# Patient Record
Sex: Male | Born: 1970 | Race: Black or African American | Hispanic: No | Marital: Single | Smoking: Former smoker
Health system: Southern US, Community
[De-identification: ages and names within clinical notes are randomized; demographics above are authoritative.]

## PROBLEM LIST (undated history)

## (undated) DIAGNOSIS — J45909 Unspecified asthma, uncomplicated: Secondary | ICD-10-CM

---

## 2014-05-05 ENCOUNTER — Ambulatory Visit: Payer: Self-pay | Admitting: Internal Medicine

## 2014-05-26 ENCOUNTER — Ambulatory Visit: Payer: Self-pay | Admitting: Internal Medicine

## 2014-12-12 ENCOUNTER — Emergency Department (HOSPITAL_COMMUNITY)
Admission: EM | Admit: 2014-12-12 | Discharge: 2014-12-13 | Disposition: A | Discharge status: Home / Self Care | Payer: Self-pay | Attending: Emergency Medicine | Admitting: Emergency Medicine

## 2014-12-12 ENCOUNTER — Emergency Department (HOSPITAL_COMMUNITY): Payer: Self-pay

## 2014-12-12 ENCOUNTER — Encounter (HOSPITAL_COMMUNITY): Payer: Self-pay | Admitting: Vascular Surgery

## 2014-12-12 DIAGNOSIS — Z87891 Personal history of nicotine dependence: Secondary | ICD-10-CM | POA: Insufficient documentation

## 2014-12-12 DIAGNOSIS — J4541 Moderate persistent asthma with (acute) exacerbation: Secondary | ICD-10-CM | POA: Insufficient documentation

## 2014-12-12 HISTORY — DX: Unspecified asthma, uncomplicated: J45.909

## 2014-12-12 MED ORDER — ALBUTEROL SULFATE (2.5 MG/3ML) 0.083% IN NEBU
INHALATION_SOLUTION | RESPIRATORY_TRACT | Status: AC
  Filled 2014-12-12: qty 3

## 2014-12-12 MED ORDER — PREDNISONE 20 MG PO TABS
60.0000 mg | ORAL_TABLET | Freq: Every day | ORAL | Status: DC
  Administered 2014-12-12: 60 mg via ORAL
  Filled 2014-12-12: qty 3

## 2014-12-12 MED ORDER — ALBUTEROL SULFATE (2.5 MG/3ML) 0.083% IN NEBU
5.0000 mg | INHALATION_SOLUTION | Freq: Once | RESPIRATORY_TRACT | Status: AC
  Administered 2014-12-12: 5 mg via RESPIRATORY_TRACT

## 2014-12-12 MED ORDER — ALBUTEROL (5 MG/ML) CONTINUOUS INHALATION SOLN
10.0000 mg/h | INHALATION_SOLUTION | RESPIRATORY_TRACT | Status: DC
  Administered 2014-12-12: 10 mg/h via RESPIRATORY_TRACT
  Filled 2014-12-12: qty 20

## 2014-12-12 NOTE — ED Provider Notes (Signed)
CSN: [id number]     Arrival date & time 12/12/14  2143 History  This chart was scribed for non-physician practitioner, Langston Masker, PA-C, working with Arby Barrette, MD, by Ronney Lion, ED Scribe. This patient was seen in room TR07C/TR07C and the patient's care was started at 10:36 PM.    Chief Complaint  Patient presents with  . Asthma  . Shortness of Breath   The history is provided by the patient. No language interpreter was used.    HPI Comments: Andrew Hammond is a 44 y.o. male with a history of asthma, who presents to the Emergency Department complaining of SOB that began yesterday afternoon. He states he has not had any SOB or asthma exacerbations since he was a child. He also notes some nasal congestion and drainage recently. Patient states the breathing treatment given to him here has provided significant relief. Patient is a former smoker but states he quit last July. Patient does not have a PCP.   Past Medical History  Diagnosis Date  . Asthma    History reviewed. No pertinent past surgical history. No family history on file. Social History  Substance Use Topics  . Smoking status: Former Smoker    Types: Cigarettes    Quit date: 10/20/2014  . Smokeless tobacco: Never Used  . Alcohol Use: Yes     Comment: occasionally    Review of Systems  HENT: Positive for congestion and rhinorrhea.   Respiratory: Positive for shortness of breath.   All other systems reviewed and are negative.   Allergies  Review of patient's allergies indicates not on file.  Home Medications   Prior to Admission medications   Not on File   BP 119/81 mmHg  Pulse 74  Temp(Src) 98.4 F (36.9 C) (Oral)  Resp 20  Ht  (1.727 m)  Wt 170 lb (77.111 kg)  BMI 25.85 kg/m2  SpO2 96% Physical Exam  Constitutional: He is oriented to person, place, and time. He appears well-developed and well-nourished. No distress.  HENT:  Head: Normocephalic and atraumatic.  Eyes: Conjunctivae and EOM are  normal.  Neck: Neck supple. No tracheal deviation present.  Cardiovascular: Normal rate.   Pulmonary/Chest: Effort normal. No respiratory distress. He has wheezes.  Wheezing in all lobes.   Musculoskeletal: Normal range of motion.  Neurological: He is alert and oriented to person, place, and time.  Skin: Skin is warm and dry.  Psychiatric: He has a normal mood and affect. His behavior is normal.  Nursing note and vitals reviewed.   ED Course  Procedures (including critical care time)  DIAGNOSTIC STUDIES: Oxygen Saturation is 96% on RA, normal by my interpretation.    COORDINATION OF CARE: 10:40 PM - Discussed treatment plan with pt at bedsde which includes CXR to r/o pneumonia, followed by another breathing trreatmnetn. Will also Rx prednisone and inhaler. Pt verbalized understanding and agreed to plan.   MDM  Chest xray negative,  Pt given prednisone  po. No relief with duo neb  Pt given 1 hour continuous neb.  re exam lungs clear   Final diagnoses:  Asthma, moderate persistent, with acute exacerbation    Prednisone taper Albuterol inhaler  Elson Areas, PA-C 12/13/14 2952  Arby Barrette, MD 12/16/14 2351

## 2014-12-12 NOTE — ED Notes (Signed)
Pt reports to the ED for eval of SOB that started yesterday. Has hx of asthma but has not had problems with it since he was a child. Also complaining of nasal congestion and drainage. Wheezing noted. Denies any CP. Pt A&Ox4, resp e/u, and skin warm and dry.

## 2014-12-13 MED ORDER — ALBUTEROL SULFATE HFA 108 (90 BASE) MCG/ACT IN AERS: 1.0000 | INHALATION_SPRAY | Freq: Four times a day (QID) | RESPIRATORY_TRACT | Status: DC | PRN

## 2014-12-13 MED ORDER — ALBUTEROL SULFATE HFA 108 (90 BASE) MCG/ACT IN AERS
2.0000 | INHALATION_SPRAY | RESPIRATORY_TRACT | Status: DC
  Administered 2014-12-13: 2 via RESPIRATORY_TRACT
  Filled 2014-12-13: qty 6.7

## 2014-12-13 MED ORDER — PREDNISONE 10 MG (21) PO TBPK: 10.0000 mg | ORAL_TABLET | Freq: Every day | ORAL | Status: DC

## 2014-12-13 NOTE — Discharge Instructions (Signed)
Asthma Attack Prevention Although there is no way to prevent asthma from starting, you can take steps to control the disease and reduce its symptoms. Learn about your asthma and how to control it. Take an active role to control your asthma by working with your health care provider to create and follow an asthma action plan. An asthma action plan guides you in:  Taking your medicines properly.  Avoiding things that set off your asthma or make your asthma worse (asthma triggers).  Tracking your level of asthma control.  Responding to worsening asthma.  Seeking emergency care when needed. To track your asthma, keep records of your symptoms, check your peak flow number using a handheld device that shows how well air moves out of your lungs (peak flow meter), and get regular asthma checkups.  WHAT ARE SOME WAYS TO PREVENT AN ASTHMA ATTACK?  Take medicines as directed by your health care provider.  Keep track of your asthma symptoms and level of control.  With your health care provider, write a detailed plan for taking medicines and managing an asthma attack. Then be sure to follow your action plan. Asthma is an ongoing condition that needs regular monitoring and treatment.  Identify and avoid asthma triggers. Many outdoor allergens and irritants (such as pollen, mold, cold air, and air pollution) can trigger asthma attacks. Find out what your asthma triggers are and take steps to avoid them.  Monitor your breathing. Learn to recognize warning signs of an attack, such as coughing, wheezing, or shortness of breath. Your lung function may decrease before you notice any signs or symptoms, so regularly measure and record your peak airflow with a home peak flow meter.  Identify and treat attacks early. If you act quickly, you are less likely to have a severe attack. You will also need less medicine to control your symptoms. When your peak flow measurements decrease and alert you to an upcoming attack,  take your medicine as instructed and immediately stop any activity that may have triggered the attack. If your symptoms do not improve, get medical help.  Pay attention to increasing quick-relief inhaler use. If you find yourself relying on your quick-relief inhaler, your asthma is not under control. See your health care provider about adjusting your treatment. WHAT CAN MAKE MY SYMPTOMS WORSE? A number of common things can set off or make your asthma symptoms worse and cause temporary increased inflammation of your airways. Keep track of your asthma symptoms for several weeks, detailing all the environmental and emotional factors that are linked with your asthma. When you have an asthma attack, go back to your asthma diary to see which factor, or combination of factors, might have contributed to it. Once you know what these factors are, you can take steps to control many of them. If you have allergies and asthma, it is important to take asthma prevention steps at home. Minimizing contact with the substance to which you are allergic will help prevent an asthma attack. Some triggers and ways to avoid these triggers are: Animal Dander:  Some people are allergic to the flakes of skin or dried saliva from animals with fur or feathers.   There is no such thing as a hypoallergenic dog or cat breed. All dogs or cats can cause allergies, even if they don't shed.  Keep these pets out of your home.  If you are not able to keep a pet outdoors, keep the pet out of your bedroom and other sleeping areas at all  times, and keep the door closed. °· Remove carpets and furniture covered with cloth from your home. If that is not possible, keep the pet away from fabric-covered furniture and carpets. °Dust Mites: °Many people with asthma are allergic to dust mites. Dust mites are tiny bugs that are found in every home in mattresses, pillows, carpets, fabric-covered furniture, bedcovers, clothes, stuffed toys, and other  fabric-covered items.  °· Cover your mattress in a special dust-proof cover. °· Cover your pillow in a special dust-proof cover, or wash the pillow each week in hot water. Water must be hotter than 130° F (54.4° C) to kill dust mites. Cold or warm water used with detergent and bleach can also be effective. °· Wash the sheets and blankets on your bed each week in hot water. °· Try not to sleep or lie on cloth-covered cushions. °· Call ahead when traveling and ask for a smoke-free hotel room. Bring your own bedding and pillows in case the hotel only supplies feather pillows and down comforters, which may contain dust mites and cause asthma symptoms. °· Remove carpets from your bedroom and those laid on concrete, if you can. °· Keep stuffed toys out of the bed, or wash the toys weekly in hot water or cooler water with detergent and bleach. °Cockroaches: °Many people with asthma are allergic to the droppings and remains of cockroaches.  °· Keep food and garbage in closed containers. Never leave food out. °· Use poison baits, traps, powders, gels, or paste (for example, boric acid). °· If a spray is used to kill cockroaches, stay out of the room until the odor goes away. °Indoor Mold: °· Fix leaky faucets, pipes, or other sources of water that have mold around them. °· Clean floors and moldy surfaces with a fungicide or diluted bleach. °· Avoid using humidifiers, vaporizers, or swamp coolers. These can spread molds through the air. °Pollen and Outdoor Mold: °· When pollen or mold spore counts are high, try to keep your windows closed. °· Stay indoors with windows closed from late morning to afternoon. Pollen and some mold spore counts are highest at that time. °· Ask your health care provider whether you need to take anti-inflammatory medicine or increase your dose of the medicine before your allergy season starts. °Other Irritants to Avoid: °· Tobacco smoke is an irritant. If you smoke, ask your health care provider how  you can quit. Ask family members to quit smoking, too. Do not allow smoking in your home or car. °· If possible, do not use a wood-burning stove, kerosene heater, or fireplace. Minimize exposure to all sources of smoke, including incense, candles, fires, and fireworks. °· Try to stay away from strong odors and sprays, such as perfume, talcum powder, hair spray, and paints. °· Decrease humidity in your home and use an indoor air cleaning device. Reduce indoor humidity to below 60%. Dehumidifiers or central air conditioners can do this. °· Decrease house dust exposure by changing furnace and air cooler filters frequently. °· Try to have someone else vacuum for you once or twice a week. Stay out of rooms while they are being vacuumed and for a short while afterward. °· If you vacuum, use a dust mask from a hardware store, a double-layered or microfilter vacuum cleaner bag, or a vacuum cleaner with a HEPA filter. °· Sulfites in foods and beverages can be irritants. Do not drink beer or wine or eat dried fruit, processed potatoes, or shrimp if they cause asthma symptoms. °· Cold   air can trigger an asthma attack. Cover your nose and mouth with a scarf on cold or windy days.  Several health conditions can make asthma more difficult to manage, including a runny nose, sinus infections, reflux disease, psychological stress, and sleep apnea. Work with your health care provider to manage these conditions.  Avoid close contact with people who have a respiratory infection such as a cold or the flu, since your asthma symptoms may get worse if you catch the infection. Wash your hands thoroughly after touching items that may have been handled by people with a respiratory infection.  Get a flu shot every year to protect against the flu virus, which often makes asthma worse for days or weeks. Also get a pneumonia shot if you have not previously had one. Unlike the flu shot, the pneumonia shot does not need to be given  yearly. Medicines:  Talk to your health care provider about whether it is safe for you to take aspirin or non-steroidal anti-inflammatory medicines (NSAIDs). In a small number of people with asthma, aspirin and NSAIDs can cause asthma attacks. These medicines must be avoided by people who have known aspirin-sensitive asthma. It is important that people with aspirin-sensitive asthma read labels of all over-the-counter medicines used to treat pain, colds, coughs, and fever.  Beta-blockers and ACE inhibitors are other medicines you should discuss with your health care provider. HOW CAN I FIND OUT WHAT I AM ALLERGIC TO? Ask your asthma health care provider about allergy skin testing or blood testing (the RAST test) to identify the allergens to which you are sensitive. If you are found to have allergies, the most important thing to do is to try to avoid exposure to any allergens that you are sensitive to as much as possible. Other treatments for allergies, such as medicines and allergy shots (immunotherapy) are available.  CAN I EXERCISE? Follow your health care provider's advice regarding asthma treatment before exercising. It is important to maintain a regular exercise program, but vigorous exercise or exercise in cold, humid, or dry environments can cause asthma attacks, especially for those people who have exercise-induced asthma. Document Released: 03/05/2009 Document Revised: 03/22/2013 Document Reviewed: 09/22/2012 [facility] Patient Information 2015 [place], [place]. This information is not intended to replace advice given to you by your health care provider. Make sure you discuss any questions you have with your health care provider.  Asthma Asthma is a recurring condition in which the airways tighten and narrow. Asthma can make it difficult to breathe. It can cause coughing, wheezing, and shortness of breath. Asthma episodes, also called asthma attacks, range from minor to life-threatening. Asthma  cannot be cured, but medicines and lifestyle changes can help control it. CAUSES Asthma is believed to be caused by inherited (genetic) and environmental factors, but its exact cause is unknown. Asthma may be triggered by allergens, lung infections, or irritants in the air. Asthma triggers are different for each person. Common triggers include:   Animal dander.  Dust mites.  Cockroaches.  Pollen from trees or grass.  Mold.  Smoke.  Air pollutants such as dust, household cleaners, hair sprays, aerosol sprays, paint fumes, strong chemicals, or strong odors.  Cold air, weather changes, and winds (which increase molds and pollens in the air).  Strong emotional expressions such as crying or laughing hard.  Stress.  Certain medicines (such as aspirin) or types of drugs (such as beta-blockers).  Sulfites in foods and drinks. Foods and drinks that may contain sulfites include dried fruit, potato  chips, and sparkling grape juice. °· Infections or inflammatory conditions such as the flu, a cold, or an inflammation of the nasal membranes (rhinitis). °· Gastroesophageal reflux disease (GERD). °· Exercise or strenuous activity. °SYMPTOMS °Symptoms may occur immediately after asthma is triggered or many hours later. Symptoms include: °· Wheezing. °· Excessive nighttime or early morning coughing. °· Frequent or severe coughing with a common cold. °· Chest tightness. °· Shortness of breath. °DIAGNOSIS  °The diagnosis of asthma is made by a review of your medical history and a physical exam. Tests may also be performed. These may include: °· Lung function studies. These tests show how much air you breathe in and out. °· Allergy tests. °· Imaging tests such as X-rays. °TREATMENT  °Asthma cannot be cured, but it can usually be controlled. Treatment involves identifying and avoiding your asthma triggers. It also involves medicines. There are 2 classes of medicine used for asthma treatment:  °· Controller  medicines. These prevent asthma symptoms from occurring. They are usually taken every day. °· Reliever or rescue medicines. These quickly relieve asthma symptoms. They are used as needed and provide short-term relief. °Your health care provider will help you create an asthma action plan. An asthma action plan is a written plan for managing and treating your asthma attacks. It includes a list of your asthma triggers and how they may be avoided. It also includes information on when medicines should be taken and when their dosage should be changed. An action plan may also involve the use of a device called a peak flow meter. A peak flow meter measures how well the lungs are working. It helps you monitor your condition. °HOME CARE INSTRUCTIONS  °· Take medicines only as directed by your health care provider. Speak with your health care provider if you have questions about how or when to take the medicines. °· Use a peak flow meter as directed by your health care provider. Record and keep track of readings. °· Understand and use the action plan to help minimize or stop an asthma attack without needing to seek medical care. °· Control your home environment in the following ways to help prevent asthma attacks: °¨ Do not smoke. Avoid being exposed to secondhand smoke. °¨ Change your heating and air conditioning filter regularly. °¨ Limit your use of fireplaces and wood stoves. °¨ Get rid of pests (such as roaches and mice) and their droppings. °¨ Throw away plants if you see mold on them. °¨ Clean your floors and dust regularly. Use unscented cleaning products. °¨ Try to have someone else vacuum for you regularly. Stay out of rooms while they are being vacuumed and for a short while afterward. If you vacuum, use a dust mask from a hardware store, a double-layered or microfilter vacuum cleaner bag, or a vacuum cleaner with a HEPA filter. °¨ Replace carpet with wood, tile, or vinyl flooring. Carpet can trap dander and  dust. °¨ Use allergy-proof pillows, mattress covers, and box spring covers. °¨ Wash bed sheets and blankets every week in hot water and dry them in a dryer. °¨ Use blankets that are made of polyester or cotton. °¨ Clean bathrooms and kitchens with bleach. If possible, have someone repaint the walls in these rooms with mold-resistant paint. Keep out of the rooms that are being cleaned and painted. °¨ Wash hands frequently. °SEEK MEDICAL CARE IF:  °· You have wheezing, shortness of breath, or a cough even if taking medicine to prevent attacks. °· The colored mucus   you cough up (sputum) is thicker than usual.  Your sputum changes from clear or white to yellow, green, gray, or bloody.  You have any problems that may be related to the medicines you are taking (such as a rash, itching, swelling, or trouble breathing).  You are using a reliever medicine more than 2-3 times per week.  Your peak flow is still at 50-79% of your personal best after following your action plan for 1 hour.  You have a fever. SEEK IMMEDIATE MEDICAL CARE IF:   You seem to be getting worse and are unresponsive to treatment during an asthma attack.  You are short of breath even at rest.  You get short of breath when doing very little physical activity.  You have difficulty eating, drinking, or talking due to asthma symptoms.  You develop chest pain.  You develop a fast heartbeat.  You have a bluish color to your lips or fingernails.  You are light-headed, dizzy, or faint.  Your peak flow is less than 50% of your personal best. MAKE SURE YOU:   Understand these instructions.  Will watch your condition.  Will get help right away if you are not doing well or get worse. Document Released: 03/17/2005 Document Revised: 08/01/2013 Document Reviewed: 10/14/2012 [facility] Patient Information 2015 [place], [place]. This information is not intended to replace advice given to you by your health care provider. Make sure you  discuss any questions you have with your health care provider.  Emergency Department Resource Guide 1) Find a Doctor and Pay Out of Pocket Although you won't have to find out who is covered by your insurance plan, it is a good idea to ask around and get recommendations. You will then need to call the office and see if the doctor you have chosen will accept you as a new patient and what types of options they offer for patients who are self-pay. Some doctors offer discounts or will set up payment plans for their patients who do not have insurance, but you will need to ask so you aren't surprised when you get to your appointment.  2) Contact Your Local Health Department Not all health departments have doctors that can see patients for sick visits, but many do, so it is worth a call to see if yours does. If you don't know where your local health department is, you can check in your phone book. The CDC also has a tool to help you locate your state's health department, and many state websites also have listings of all of their local health departments.  3) Find a Walk-in Clinic If your illness is not likely to be very severe or complicated, you may want to try a walk in clinic. These are popping up all over the country in pharmacies, drugstores, and shopping centers. They're usually staffed by nurse practitioners or physician assistants that have been trained to treat common illnesses and complaints. They're usually fairly quick and inexpensive. However, if you have serious medical issues or chronic medical problems, these are probably not your best option.  No Primary Care Doctor: - Call Health Connect at  [phone] - they can help you locate a primary care doctor that  accepts your insurance, provides certain services, etc. - Physician Referral Service- [phone]  Chronic Pain Problems: Organization         Address  Phone   Notes  [facility]  [phone] Patients need to be  referred by their primary care doctor.  Medication Assistance: Organization         Address  Phone   Notes  [facility] [address] [place], [place] [address] [phone] --Must be a resident of [facility] [place] -- Must have NO insurance coverage whatsoever (no Medicaid/ Medicare, etc.) -- The pt. MUST have a primary care doctor that directs their care regularly and follows them in the community   [facility]  [phone]   [employer]  [phone]    Agencies that provide inexpensive medical care: Organization         Address  Phone   Notes  [facility]  [phone]   [facility]    [phone]   [facility] [address] [place], [place] [address] [phone]   [facility] [address], [place] [phone]   [facility]    [phone]   [facility]    [phone]   [facility]  [address], [place] Phone:  [phone], Fax:  [phone] Hours of Operation:  9 am - 6 pm, M-F.  Also accepts Medicaid/Medicare and self-pay.  [facility]  [address], [place] Phone: [phone]) [phone], Fax: [phone]) [phone]. Hours of Operation:  8:30 am - 5:30 pm, M-F.  Also accepts Medicaid and self-pay.  [facility] [place] [address], [place] Phone: [phone]) [phone]   [facility] [address], [place], [place] [phone], Ext. [phone] Mondays & Thursdays: 7-9 AM.  First 15 patients are seen on a first come, first serve basis.    Medicaid-accepting [facility] Providers:  Organization         Address  Phone   Notes  [facility] [address], [place] [phone] Also accepts self-pay patients.  [facility] [address], [place]  [phone]   [facility] [address], [place] [phone]   [facility] [address] [place] [address], [place] [phone]   Renaye RakersVeita Bland [address], [place]   [phone] Only accepts [place] Access [place] patients after they have their name applied to their card.   Self-Pay (no insurance) in [facility] [place]:  Organization         Address  Phone   Notes  Sickle Cell Patients, [facility] [address], [place] [phone]   [facility] [address], [place] [phone]   [facility] [place]  [address], [place] [phone]   [facility]/Dr. Osei-Bonsu  [address] [place] [address], [place] or [id number], [place] [phone] Phone number for both [place] and [place] locations is the same.  [facility] [address], [place] [phone]   [facility] [place] [address] [place] [address], [place] or [address] [phone]   [facility] [address], [place] [phone]) [phone], phone; [phone]) [phone], fax Sees patients 1st and 3rd Saturday of every month.  Must not qualify for public or private insurance (i.e. Medicaid, Medicare, [place] Health Choice, Veterans' Benefits)  Household income should be no more than 200% of the poverty level The clinic  cannot treat you if you are pregnant or think you are pregnant  Sexually transmitted diseases are not treated at the clinic.    Dental Care: Organization         Address  Phone  Notes  [facility] Department of [facility] [address] [place], [place] [phone] Accepts children up to age 66 who are enrolled in [place] or [place] Health Choice; pregnant women with a Medicaid card; and children who have applied for Medicaid or [place] Health Choice, but were declined, whose parents can  pay a reduced fee at time of service.  [facility] Department of [facility]  [address], [place] [phone] Accepts children up to age 1 who are enrolled in [place] or [place] Health Choice; pregnant women with a Medicaid card; and children who have applied for Medicaid or [place] Health Choice, but were declined, whose parents can pay a reduced fee at time of service.  [facility]  [address] [place], [place] [phone] Patients are seen by appointment only. Walk-ins are not accepted. [facility] will see patients 47 years of age and older. Monday - Tuesday (8am-5pm) Most Wednesdays (8:30-5pm) $30 per visit, cash only  [facility]  [address], [facility] [place] [phone] Patients are seen by appointment only. Walk-ins are not accepted. [facility] will see patients 17 years of age and older. One Wednesday Evening (Monthly: Volunteer Based).  $30 per visit, cash only  [employer]  [phone] for adults; Children under age 42, call Graduate Pediatric Dentistry at [phone]. Children aged 27-14, please call [phone] to request a pediatric application.  Dental services are provided in all areas of dental care including fillings, crowns and bridges, complete and partial dentures, implants, gum treatment, root canals, and extractions. Preventive care is also provided. Treatment is provided to both adults and children. Patients are selected via a lottery and there is often a waiting list.   [facility] [address] [place]  [phone] [email or web address]   [facility] [address] [place], [place] [phone], Ext. [phone] Second and Fourth Thursday of each month, opens at 6:30 AM; Clinic ends at 9 AM.  Patients are seen on a first-come first-served basis, and a limited number are seen during each clinic.   [facility]  [address] [place], [place] [phone]   Eligibility Requirements You must have lived in [place], [place], or [place] counties for at least the last three months.   You cannot be eligible for state or federal sponsored [place], including [employer], [place], or [employer].   You generally cannot be eligible for healthcare insurance through your employer.    How to apply: Eligibility screenings are held every Tuesday and Wednesday afternoon from 1:00 pm until 4:00 pm. You do not need an appointment for the interview!  [facility] [address] [place], [place] [phone]   [facility] Health Department  [phone]   [facility] Health Department  [phone]   [facility] Health Department  [phone]    Behavioral Health Resources in the Community: Intensive Outpatient Programs Organization         Address  Phone  Notes  [facility] [address] [place], [place] [phone]   [facility] Outpatient [address], [place], [place]  [phone]   [facility] [address], [place], [place]  [phone]   [facility] Mental Health [address]  [place], [place] [phone] or [phone]   Substance Abuse Resources Organization         Address  Phone  Notes  [facility]  [phone]   [facility]  [phone]   [facility]  [phone]   Floydene Flock  [phone]   Residential & Outpatient Substance Abuse Program  [phone]   Psychological Services Organization         Address  Phone  Notes  [facility]  [phone]   [facility] Services  [phone]   [facility] Mental Health [address], [place] [phone] or [phone]    Mobile Crisis Teams Organization         Address  Phone  Notes  [facility], Mobile Crisis Care Unit  [phone]    [facility]  [address] [place], [place] [phone]   Doristine Locks [address] [place] [phone]    Self-Help/Support Groups Organization         Address  Phone             Notes  Mental Health Assoc. of  - variety of support groups  336- I7437963 Call for more information  Narcotics Anonymous (NA), [facility] [address], [employer]  2 meetings at this location   Statistician         Address  Phone  Notes  [facility] [address],    [place]  [phone]   [facility]  [address], [place] [address], [place], [place] [phone]   [facility] [address] [place], [place] [phone] Admissions: 8am-3pm M-F  [facility] [address]    [place], [place] [phone]   [facility] [address] [place], [place], [place] [phone]   [facility] [address]  [place], [place] [phone]   [facility] Intensive Outpatient [address], [place], [place] [phone]   [facility] ([facility] [address] [place].,  [place], [place] [phone] or [phone]   [facility] ([facility]) [address], [place], [place] [phone] Accepts Medicaid  [facility] [address]  [place] [phone] Substance Abuse/Addiction Treatment   [facility] Organization         Address  Phone  Notes  [facility]  [phone]   Angie Fava, PhD [address] Kn[address] [place], [place]   [phone] or [phone]   [facility]al   [address] [place], [place] [phone]   [facility] [address] [place], [place] [phone] Insurance/Medicaid/sponsorship through [facility] [address]                                     [place], [place] [phone] Therapy/tele-psych/case  [facility] [address], [place] [phone]    Dr. Lolly Mustache  [phone]   [facility]  [employer] [facility] Dept. 1) [address] [place] 2) [address] [place] 3)  [address], [place] [phone]  (  [phone]   °[place] Child Abuse Hotline (336) 342-1394 or (336) 342-3537 (After Hours)    ° ° °

## 2016-11-20 ENCOUNTER — Emergency Department (HOSPITAL_COMMUNITY)
Admission: EM | Admit: 2016-11-20 | Discharge: 2016-11-20 | Disposition: A | Discharge status: Home / Self Care | Payer: Self-pay | Attending: Emergency Medicine | Admitting: Emergency Medicine

## 2016-11-20 ENCOUNTER — Encounter (HOSPITAL_COMMUNITY): Payer: Self-pay | Admitting: Emergency Medicine

## 2016-11-20 ENCOUNTER — Emergency Department (HOSPITAL_COMMUNITY): Payer: Self-pay

## 2016-11-20 DIAGNOSIS — Z87891 Personal history of nicotine dependence: Secondary | ICD-10-CM | POA: Insufficient documentation

## 2016-11-20 DIAGNOSIS — J4521 Mild intermittent asthma with (acute) exacerbation: Secondary | ICD-10-CM | POA: Insufficient documentation

## 2016-11-20 MED ORDER — ALBUTEROL SULFATE (2.5 MG/3ML) 0.083% IN NEBU
INHALATION_SOLUTION | RESPIRATORY_TRACT | Status: AC
  Filled 2016-11-20: qty 6

## 2016-11-20 MED ORDER — PREDNISONE 20 MG PO TABS
60.0000 mg | ORAL_TABLET | Freq: Once | ORAL | Status: AC
  Administered 2016-11-20: 60 mg via ORAL
  Filled 2016-11-20: qty 3

## 2016-11-20 MED ORDER — ALBUTEROL SULFATE HFA 108 (90 BASE) MCG/ACT IN AERS
2.0000 | INHALATION_SPRAY | Freq: Once | RESPIRATORY_TRACT | Status: AC
  Administered 2016-11-20: 2 via RESPIRATORY_TRACT
  Filled 2016-11-20: qty 6.7

## 2016-11-20 MED ORDER — ALBUTEROL (5 MG/ML) CONTINUOUS INHALATION SOLN
10.0000 mg/h | INHALATION_SOLUTION | Freq: Once | RESPIRATORY_TRACT | Status: AC
  Administered 2016-11-20: 10 mg/h via RESPIRATORY_TRACT
  Filled 2016-11-20: qty 20

## 2016-11-20 MED ORDER — ALBUTEROL SULFATE (2.5 MG/3ML) 0.083% IN NEBU
5.0000 mg | INHALATION_SOLUTION | Freq: Once | RESPIRATORY_TRACT | Status: AC
  Administered 2016-11-20: 5 mg via RESPIRATORY_TRACT

## 2016-11-20 MED ORDER — IPRATROPIUM BROMIDE 0.02 % IN SOLN
0.5000 mg | Freq: Once | RESPIRATORY_TRACT | Status: AC
  Administered 2016-11-20: 0.5 mg via RESPIRATORY_TRACT
  Filled 2016-11-20: qty 2.5

## 2016-11-20 MED ORDER — PREDNISONE 10 MG (21) PO TBPK: ORAL_TABLET | Freq: Every day | ORAL | 0 refills | Status: DC

## 2016-11-20 NOTE — Discharge Instructions (Signed)
Inhaler every 4 hrs for shortness of breath. Prednisone as prescribed until all gone. Follow up as needed. Return if worsening.

## 2016-11-20 NOTE — ED Triage Notes (Signed)
Pt c/o shortness of breath that began yesterday. Hx asthma, no inhalers at home, lung sounds diminished.

## 2016-11-20 NOTE — ED Provider Notes (Signed)
[facility]EM[facility] Provider Note   CSN: [id number] Arrival date & time: 11/20/16  0117     History   Chief Complaint Chief Complaint  Patient presents with  . Shortness of Breath    HPI Andrew Hammond is a 46 y.o. male.  HPI Andrew Hammond is a 46 y.o. malewith history of asthma, presents to emergency department complaining of wheezing and shortness of breath. Patient states that his symptoms have been gradually getting worse over the last week. He reports dry nonproductive cough. He states he has been using nebulizer machine at home with no improvement. He denies any chest pain. Denies any productive cough. No fever or chills. He states his symptoms are worse on exertion.he is not a smoker. Denies frequent asthma exacerbations. He denies any particular triggers for his exacerbation. He is not a smoker.  Past Medical History:  Diagnosis Date  . Asthma     There are no active problems to display for this patient.   History reviewed. No pertinent surgical history.     Home Medications    Prior to Admission medications   Medication Sig Start Date End Date Taking? Authorizing Provider  albuterol (PROVENTIL HFA;VENTOLIN HFA) 108 (90 BASE) MCG/ACT inhaler Inhale 1-2 puffs into the lungs every 6 (six) hours as needed for wheezing or shortness of breath. Patient not taking: Reported on 11/20/2016 12/13/14   Elson Areas, PA-C  predniSONE (STERAPRED UNI-PAK 21 TAB) 10 MG (21) TBPK tablet Take 1 tablet (10 mg total) by mouth daily. 7,8,2,9,5,6 taper Patient not taking: Reported on 11/20/2016 12/13/14   Andrew Hammond    Family History No family history on file.  Social History Social History  Substance Use Topics  . Smoking status: Former Smoker    Types: Cigarettes    Quit date: 10/20/2014  . Smokeless tobacco: Never Used  . Alcohol use Yes     Comment: occasionally     Allergies   Patient has no known allergies.   Review of Systems Review of Systems    Constitutional: Negative for chills and fever.  Respiratory: Positive for cough and shortness of breath. Negative for chest tightness.   Cardiovascular: Negative for chest pain, palpitations and leg swelling.  Gastrointestinal: Negative for abdominal distention, abdominal pain, diarrhea, nausea and vomiting.  Genitourinary: Negative for dysuria, frequency, hematuria and urgency.  Musculoskeletal: Negative for arthralgias, myalgias, neck pain and neck stiffness.  Skin: Negative for rash.  Allergic/Immunologic: Negative for immunocompromised state.  Neurological: Negative for dizziness, weakness, light-headedness, numbness and headaches.  All other systems reviewed and are negative.    Physical Exam Updated Vital Signs BP 117/86   Pulse 68   Temp 98.5 F (36.9 C) (Oral)   Resp (!) 22   Ht 5\' 8"  (1.727 m)   Wt 68 kg (150 lb)   SpO2 99%   BMI 22.81 kg/m   Physical Exam  Constitutional: He appears well-developed and well-nourished. No distress.  HENT:  Head: Normocephalic and atraumatic.  Eyes: Conjunctivae are normal.  Neck: Neck supple.  Cardiovascular: Normal rate, regular rhythm and normal heart sounds.   Pulmonary/Chest: Effort normal. No respiratory distress. He has wheezes. He has no rales.  Inspiratory and expiratory wheezes bilaterally. Decreased air movement bilaterally.  Abdominal: Soft. Bowel sounds are normal. He exhibits no distension. There is no tenderness. There is no rebound.  Musculoskeletal: He exhibits no edema.  Neurological: He is alert.  Skin: Skin is warm and dry.  Nursing note and vitals  reviewed.    ED Treatments / Results  Labs (all labs ordered are listed, but only abnormal results are displayed) Labs Reviewed - No data to display  EKG  EKG Interpretation None       Radiology Dg Chest 2 View  Result Date: 11/20/2016 CLINICAL DATA:  Chest pain and shortness of breath. EXAM: CHEST  2 VIEW COMPARISON:  12/12/2014 FINDINGS: The lungs are  hyperinflated. The cardiomediastinal contours are normal. Pulmonary vasculature is normal. No consolidation, pleural effusion, or pneumothorax. No acute osseous abnormalities are seen. IMPRESSION: Hyperinflation consistent with asthma or bronchitis. No localizing abnormality. Electronically Signed   By: Rubye Oaks M.D.   On: 11/20/2016 01:56    Procedures Procedures (including critical care time)  Medications Ordered in ED Medications  albuterol (PROVENTIL) (2.5 MG/3ML) 0.083% nebulizer solution (not administered)  albuterol (PROVENTIL,VENTOLIN) solution continuous neb (not administered)  albuterol (PROVENTIL) (2.5 MG/3ML) 0.083% nebulizer solution 5 mg (5 mg Nebulization Given 11/20/16 0124)  predniSONE (DELTASONE) tablet 60 mg (60 mg Oral Given 11/20/16 0436)  ipratropium (ATROVENT) nebulizer solution 0.5 mg (0.5 mg Nebulization Given 11/20/16 0437)     Initial Impression / Assessment and Plan / ED Course  I have reviewed the triage vital signs and the nursing notes.  Pertinent labs & imaging results that were available during my care of the patient were reviewed by me and considered in my medical decision making (see chart for details).     Patient emergency department with increased wheezing, shortness of breath, dry nonproductive cough. History of asthma. Patient with inspiratory and expiratory wheezes. Already received a breathing treatment with no improvement. Will start hour-long treatment in order prednisone. VS normal. Speaking full sentences.   Patient improved after hour-long treatment. He is still wheezing, states he's feeling much better. Oxygen saturation 100%. He appears to be more comfortable. He is asking to see if he can get an inhaler and a work note. Will discharge home with an inhaler, prednisone  Taper, follow-up with family doctor. Return precautions discussed. Vital signs are all within normal at time of discharge.  Vitals:   11/20/16 0530 11/20/16 0540  11/20/16 0600 11/20/16 0630  BP: (!) 119/93  115/79 108/69  Pulse: 89  62 93  Resp: (!) 35  15 15  Temp:      TempSrc:      SpO2: 95% 99% 100% 100%  Weight:      Height:         Final Clinical Impressions(s) / ED Diagnoses   Final diagnoses:  Mild intermittent asthma with exacerbation    New Prescriptions Discharge Medication List as of 11/20/2016  6:45 AM       Jaynie Crumble, PA-C 11/21/16 0534    Zadie Rhine, MD 11/21/16 [phone]

## 2016-11-24 ENCOUNTER — Ambulatory Visit: Payer: Self-pay | Admitting: Family Medicine

## 2016-11-28 ENCOUNTER — Ambulatory Visit: Payer: Self-pay | Admitting: Family Medicine

## 2016-12-03 ENCOUNTER — Ambulatory Visit: Payer: Self-pay | Admitting: Family Medicine

## 2016-12-30 ENCOUNTER — Encounter (HOSPITAL_COMMUNITY): Payer: Self-pay | Admitting: Family Medicine

## 2016-12-30 ENCOUNTER — Ambulatory Visit (HOSPITAL_COMMUNITY)
Admission: EM | Admit: 2016-12-30 | Discharge: 2016-12-30 | Disposition: A | Discharge status: Home / Self Care | Payer: Self-pay | Attending: Family Medicine | Admitting: Family Medicine

## 2016-12-30 DIAGNOSIS — J4531 Mild persistent asthma with (acute) exacerbation: Secondary | ICD-10-CM

## 2016-12-30 DIAGNOSIS — R062 Wheezing: Secondary | ICD-10-CM

## 2016-12-30 MED ORDER — ALBUTEROL SULFATE HFA 108 (90 BASE) MCG/ACT IN AERS
2.0000 | INHALATION_SPRAY | Freq: Once | RESPIRATORY_TRACT | Status: AC
  Administered 2016-12-30: 2 via RESPIRATORY_TRACT

## 2016-12-30 MED ORDER — IPRATROPIUM-ALBUTEROL 0.5-2.5 (3) MG/3ML IN SOLN
3.0000 mL | Freq: Once | RESPIRATORY_TRACT | Status: AC
  Administered 2016-12-30: 3 mL via RESPIRATORY_TRACT

## 2016-12-30 MED ORDER — AEROCHAMBER PLUS FLO-VU LARGE MISC
Status: AC
  Filled 2016-12-30: qty 1

## 2016-12-30 MED ORDER — IPRATROPIUM-ALBUTEROL 0.5-2.5 (3) MG/3ML IN SOLN
RESPIRATORY_TRACT | Status: AC
  Filled 2016-12-30: qty 3

## 2016-12-30 MED ORDER — ALBUTEROL SULFATE HFA 108 (90 BASE) MCG/ACT IN AERS: 2.0000 | INHALATION_SPRAY | RESPIRATORY_TRACT | 1 refills | Status: DC | PRN

## 2016-12-30 MED ORDER — PREDNISONE 20 MG PO TABS: ORAL_TABLET | ORAL | 1 refills | Status: DC

## 2016-12-30 MED ORDER — ALBUTEROL SULFATE HFA 108 (90 BASE) MCG/ACT IN AERS
INHALATION_SPRAY | RESPIRATORY_TRACT | Status: AC
  Filled 2016-12-30: qty 6.7

## 2016-12-30 MED ORDER — AEROCHAMBER PLUS FLO-VU MEDIUM MISC
1.0000 | Freq: Once | Status: AC
  Administered 2016-12-30: 1

## 2016-12-30 NOTE — ED Triage Notes (Signed)
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   A&O x4... NAD... Ambulatory

## 2016-12-30 NOTE — ED Provider Notes (Addendum)
  [facility]   [id number] 12/30/16 Arrival Time: 1515   SUBJECTIVE:  Andrew Hammond is a 46 y.o. male who presents to the urgent care with complaint of asthma exacerbation.  He works in [employer] and feels that this is contributing to his problem. He is a former smoker.  He was treated a month ago with steroids and inhalers and did fairly well until several days ago when he started getting the wheezing again. He much worse last night and today he's had to cease working.     Past Medical History:  Diagnosis Date  . Asthma    History reviewed. No pertinent family history. Social History   Social History  . Marital status: Single    Spouse name: N/A  . Number of children: N/A  . Years of education: N/A   Occupational History  . Not on file.   Social History Main Topics  . Smoking status: Former Smoker    Types: Cigarettes    Quit date: 10/20/2014  . Smokeless tobacco: Never Used  . Alcohol use Yes     Comment: occasionally  . Drug use: No  . Sexual activity: Not on file   Other Topics Concern  . Not on file   Social History Narrative  . No narrative on file   No outpatient prescriptions have been marked as taking for the 12/30/16 encounter [facility] Encounter).   No Known Allergies    ROS: As per HPI, remainder of ROS negative.   OBJECTIVE:   Vitals:   12/30/16 1554  BP: 113/77  Pulse: 69  Resp: (!) 22  Temp: 98.2 F (36.8 C)  TempSrc: Oral  SpO2: 95%     General appearance: alert; no distress Eyes: PERRL; EOMI; conjunctiva normal HENT: normocephalic; atraumatic; TMs normal, canal normal, external ears normal without trauma; nasal mucosa normal; oral mucosa normal Neck: supple Lungs: chest tight with diffuse expiratory wheezes bilaterally Heart: regular rate and rhythm Back: no CVA tenderness Extremities: no cyanosis or edema; symmetrical with no gross deformities Skin: warm and dry Neurologic: normal gait; grossly  normal Psychological: alert and cooperative; normal mood and affect    Great improvement after treatment     ASSESSMENT & PLAN:  1. Mild persistent asthma with exacerbation     Meds ordered this encounter  Medications  . albuterol (PROVENTIL HFA;VENTOLIN HFA) 108 (90 Base) MCG/ACT inhaler 2 puff  . ipratropium-albuterol (DUONEB) 0.5-2.5 (3) MG/3ML nebulizer solution 3 mL  . predniSONE (DELTASONE) 20 MG tablet    Sig: Three daily with food for two days, then two daily for 5 days    Dispense:  16 tablet    Refill:  1  . albuterol (PROVENTIL HFA;VENTOLIN HFA) 108 (90 Base) MCG/ACT inhaler    Sig: Inhale 2 puffs into the lungs every 4 (four) hours as needed for wheezing or shortness of breath (cough, shortness of breath or wheezing.).    Dispense:  1 Inhaler    Refill:  1  . AEROCHAMBER PLUS FLO-VU MEDIUM MISC 1 each    Reviewed expectations re: course of current medical issues. Questions answered. Outlined signs and symptoms indicating need for more acute intervention. Patient verbalized understanding. After Visit Summary given.    Procedures:      Elvina Sidle, MD 12/30/16 1617    Elvina Sidle, MD 12/30/16 [phone]

## 2018-01-11 ENCOUNTER — Encounter (HOSPITAL_COMMUNITY): Payer: Self-pay | Admitting: Emergency Medicine

## 2018-01-11 ENCOUNTER — Emergency Department (HOSPITAL_COMMUNITY)
Admission: EM | Admit: 2018-01-11 | Discharge: 2018-01-11 | Disposition: A | Discharge status: Home / Self Care | Payer: Self-pay | Attending: Emergency Medicine | Admitting: Emergency Medicine

## 2018-01-11 DIAGNOSIS — Z87891 Personal history of nicotine dependence: Secondary | ICD-10-CM | POA: Insufficient documentation

## 2018-01-11 DIAGNOSIS — J45901 Unspecified asthma with (acute) exacerbation: Secondary | ICD-10-CM | POA: Insufficient documentation

## 2018-01-11 MED ORDER — ALBUTEROL SULFATE (2.5 MG/3ML) 0.083% IN NEBU
5.0000 mg | INHALATION_SOLUTION | Freq: Once | RESPIRATORY_TRACT | Status: AC
  Administered 2018-01-11: 5 mg via RESPIRATORY_TRACT
  Filled 2018-01-11: qty 6

## 2018-01-11 MED ORDER — PREDNISONE 20 MG PO TABS
40.0000 mg | ORAL_TABLET | Freq: Once | ORAL | Status: AC
  Administered 2018-01-11: 40 mg via ORAL
  Filled 2018-01-11: qty 2

## 2018-01-11 MED ORDER — ALBUTEROL SULFATE HFA 108 (90 BASE) MCG/ACT IN AERS
1.0000 | INHALATION_SPRAY | Freq: Four times a day (QID) | RESPIRATORY_TRACT | 2 refills | Status: AC | PRN
Start: 2018-01-11 — End: ?

## 2018-01-11 MED ORDER — ALBUTEROL SULFATE HFA 108 (90 BASE) MCG/ACT IN AERS
1.0000 | INHALATION_SPRAY | Freq: Once | RESPIRATORY_TRACT | Status: AC
  Administered 2018-01-11: 1 via RESPIRATORY_TRACT
  Filled 2018-01-11: qty 6.7

## 2018-01-11 MED ORDER — ALBUTEROL SULFATE (2.5 MG/3ML) 0.083% IN NEBU
2.5000 mg | INHALATION_SOLUTION | Freq: Once | RESPIRATORY_TRACT | Status: AC
  Administered 2018-01-11: 2.5 mg via RESPIRATORY_TRACT
  Filled 2018-01-11: qty 3

## 2018-01-11 MED ORDER — PREDNISONE 20 MG PO TABS: 40.0000 mg | ORAL_TABLET | Freq: Every day | ORAL | 0 refills | Status: AC

## 2018-01-11 NOTE — ED Notes (Signed)
ED Provider at bedside. 

## 2018-01-11 NOTE — Discharge Instructions (Addendum)
Please read attached information. If you experience any new or worsening signs or symptoms please return to the emergency room for evaluation. Please follow-up with your primary care provider or specialist as discussed. Please use medication prescribed only as directed and discontinue taking if you have any concerning signs or symptoms.   °

## 2018-01-11 NOTE — ED Triage Notes (Addendum)
Pt states he has been out of his inhaler for months. Pt has wheezing throughout his lungs. Pt placed on triage orderset neb

## 2018-01-11 NOTE — ED Notes (Signed)
Patient verbalizes understanding of discharge instructions. Opportunity for questioning and answers were provided. Armband removed by staff, pt discharged from ED. Pt ambulatory to the lobby.  

## 2018-01-11 NOTE — ED Provider Notes (Signed)
[facility] EMERGENCY DEPARTMENT Provider Note   CSN: [id number] Arrival date & time: 01/11/18  1350     History   Chief Complaint Chief Complaint  Patient presents with  . Asthma    HPI Andrew Hammond is a 47 y.o. male.  HPI   38 YOF presents today with complaints of asthma exacerbation.  Patient notes that symptoms started yesterday, he denies any known abnormal exposures, denies any fever chills, productive cough, or chest pain.  Patient notes that this feels similar to previous asthma exacerbations although he has not had one in an extended period of time.  Patient notes he does not have an inhaler at home.  Past Medical History:  Diagnosis Date  . Asthma     There are no active problems to display for this patient.   No past surgical history on file.      Home Medications    Prior to Admission medications   Medication Sig Start Date End Date Taking? Authorizing Provider  albuterol (PROVENTIL HFA;VENTOLIN HFA) 108 (90 Base) MCG/ACT inhaler Inhale 1-2 puffs into the lungs every 6 (six) hours as needed for wheezing or shortness of breath. 01/11/18   Lillias Difrancesco, Tinnie Gens, PA-C  predniSONE (DELTASONE) 20 MG tablet Take 2 tablets (40 mg total) by mouth daily for 5 days. 01/11/18 01/16/18  Eyvonne Mechanic, PA-C    Family History No family history on file.  Social History Social History   Tobacco Use  . Smoking status: Former Smoker    Types: Cigarettes    Last attempt to quit: 10/20/2014    Years since quitting: 3.2  . Smokeless tobacco: Never Used  Substance Use Topics  . Alcohol use: Yes    Comment: occasionally  . Drug use: No     Allergies   Patient has no known allergies.   Review of Systems Review of Systems  All other systems reviewed and are negative.   Physical Exam Updated Vital Signs BP 111/75   Pulse 86   Resp (!) 24   SpO2 98%   Physical Exam  Constitutional: He is oriented to person, place, and time. He appears  well-developed and well-nourished.  HENT:  Head: Normocephalic and atraumatic.  Eyes: Pupils are equal, round, and reactive to light. Conjunctivae are normal. Right eye exhibits no discharge. Left eye exhibits no discharge. No scleral icterus.  Neck: Normal range of motion. No JVD present. No tracheal deviation present.  Cardiovascular: Normal rate, regular rhythm and normal heart sounds. Exam reveals no gallop and no friction rub.  No murmur heard. Pulmonary/Chest: Effort normal. No stridor.  Minor bilateral expiratory wheeze noted, no crackles, no respiratory distress  Neurological: He is alert and oriented to person, place, and time. Coordination normal.  Psychiatric: He has a normal mood and affect. His behavior is normal. Judgment and thought content normal.  Nursing note and vitals reviewed.   ED Treatments / Results  Labs (all labs ordered are listed, but only abnormal results are displayed) Labs Reviewed - No data to display  EKG None  Radiology No results found.  Procedures Procedures (including critical care time)  Medications Ordered in ED Medications  albuterol (PROVENTIL HFA;VENTOLIN HFA) 108 (90 Base) MCG/ACT inhaler 1-2 puff (has no administration in time range)  predniSONE (DELTASONE) tablet 40 mg (has no administration in time range)  albuterol (PROVENTIL) (2.5 MG/3ML) 0.083% nebulizer solution 5 mg (5 mg Nebulization Given 01/11/18 1400)  albuterol (PROVENTIL) (2.5 MG/3ML) 0.083% nebulizer solution 2.5 mg (2.5 mg  Nebulization Given 01/11/18 1511)     Initial Impression / Assessment and Plan / ED Course  I have reviewed the triage vital signs and the nursing notes.  Pertinent labs & imaging results that were available during my care of the patient were reviewed by me and considered in my medical decision making (see chart for details).     Labs:   Imaging:  Consults:  Therapeutics: Albuterol, ipratropium, prednisone  Discharge Meds: Albuterol,  prednisone  Assessment/Plan: 11 YOM presents today with asthma exacerbation.  No infectious signs or symptoms, breathing improved here with treatments.  Patient does have continued minor expiratory wheeze and will be treated with steroids and albuterol.  Patient discharged with outpatient follow-up and strict return precautions.  He verbalized understanding and agreement to today's plan had no further questions or concerns.   Final Clinical Impressions(s) / ED Diagnoses   Final diagnoses:  Mild asthma with exacerbation, unspecified whether persistent    ED Discharge Orders         Ordered    predniSONE (DELTASONE) 20 MG tablet  Daily     01/11/18 1610    albuterol (PROVENTIL HFA;VENTOLIN HFA) 108 (90 Base) MCG/ACT inhaler  Every 6 hours PRN     01/11/18 1610           Alexio Sroka, Tinnie Gens, PA-C 01/11/18 1612    Alvira Monday, MD 01/12/18 2223

## 2018-01-11 NOTE — ED Provider Notes (Signed)
Patient placed in Quick Look pathway, seen and evaluated   Chief Complaint: SOB 2/t asthma  HPI:   Pt presenting with worsening shortness of breath since yesterday, treated to his asthma.  States he is out of his inhalers.  He normally uses albuterol for his asthma.  Denies upper respiratory infectious symptoms.  ROS: +shortness of breath  Physical Exam:   Gen: Increased work of breathing  Neuro: Awake and Alert  Skin: Warm    Focused Exam: Increased work of breathing, diffuse expiratory wheezes bilaterally.  tachypneic.  O2 saturation 98%.   Initiation of care has begun. The patient has been counseled on the process, plan, and necessity for staying for the completion/evaluation, and the remainder of the medical screening examination    Cyani Kallstrom, [place] N, PA-C 01/11/18 1408    Cathren Laine, MD 01/11/18 [phone]

## 2018-01-11 NOTE — ED Notes (Signed)
Pt reports improved breathing post neb treatment

## 2018-07-27 IMAGING — DX DG CHEST 2V
2 series · 2 of 2 positions shown · non-contrast
Comparison: 12/12/2014

CLINICAL DATA: Chest pain and shortness of breath.

EXAM:
CHEST  2 VIEW

[chest pa]
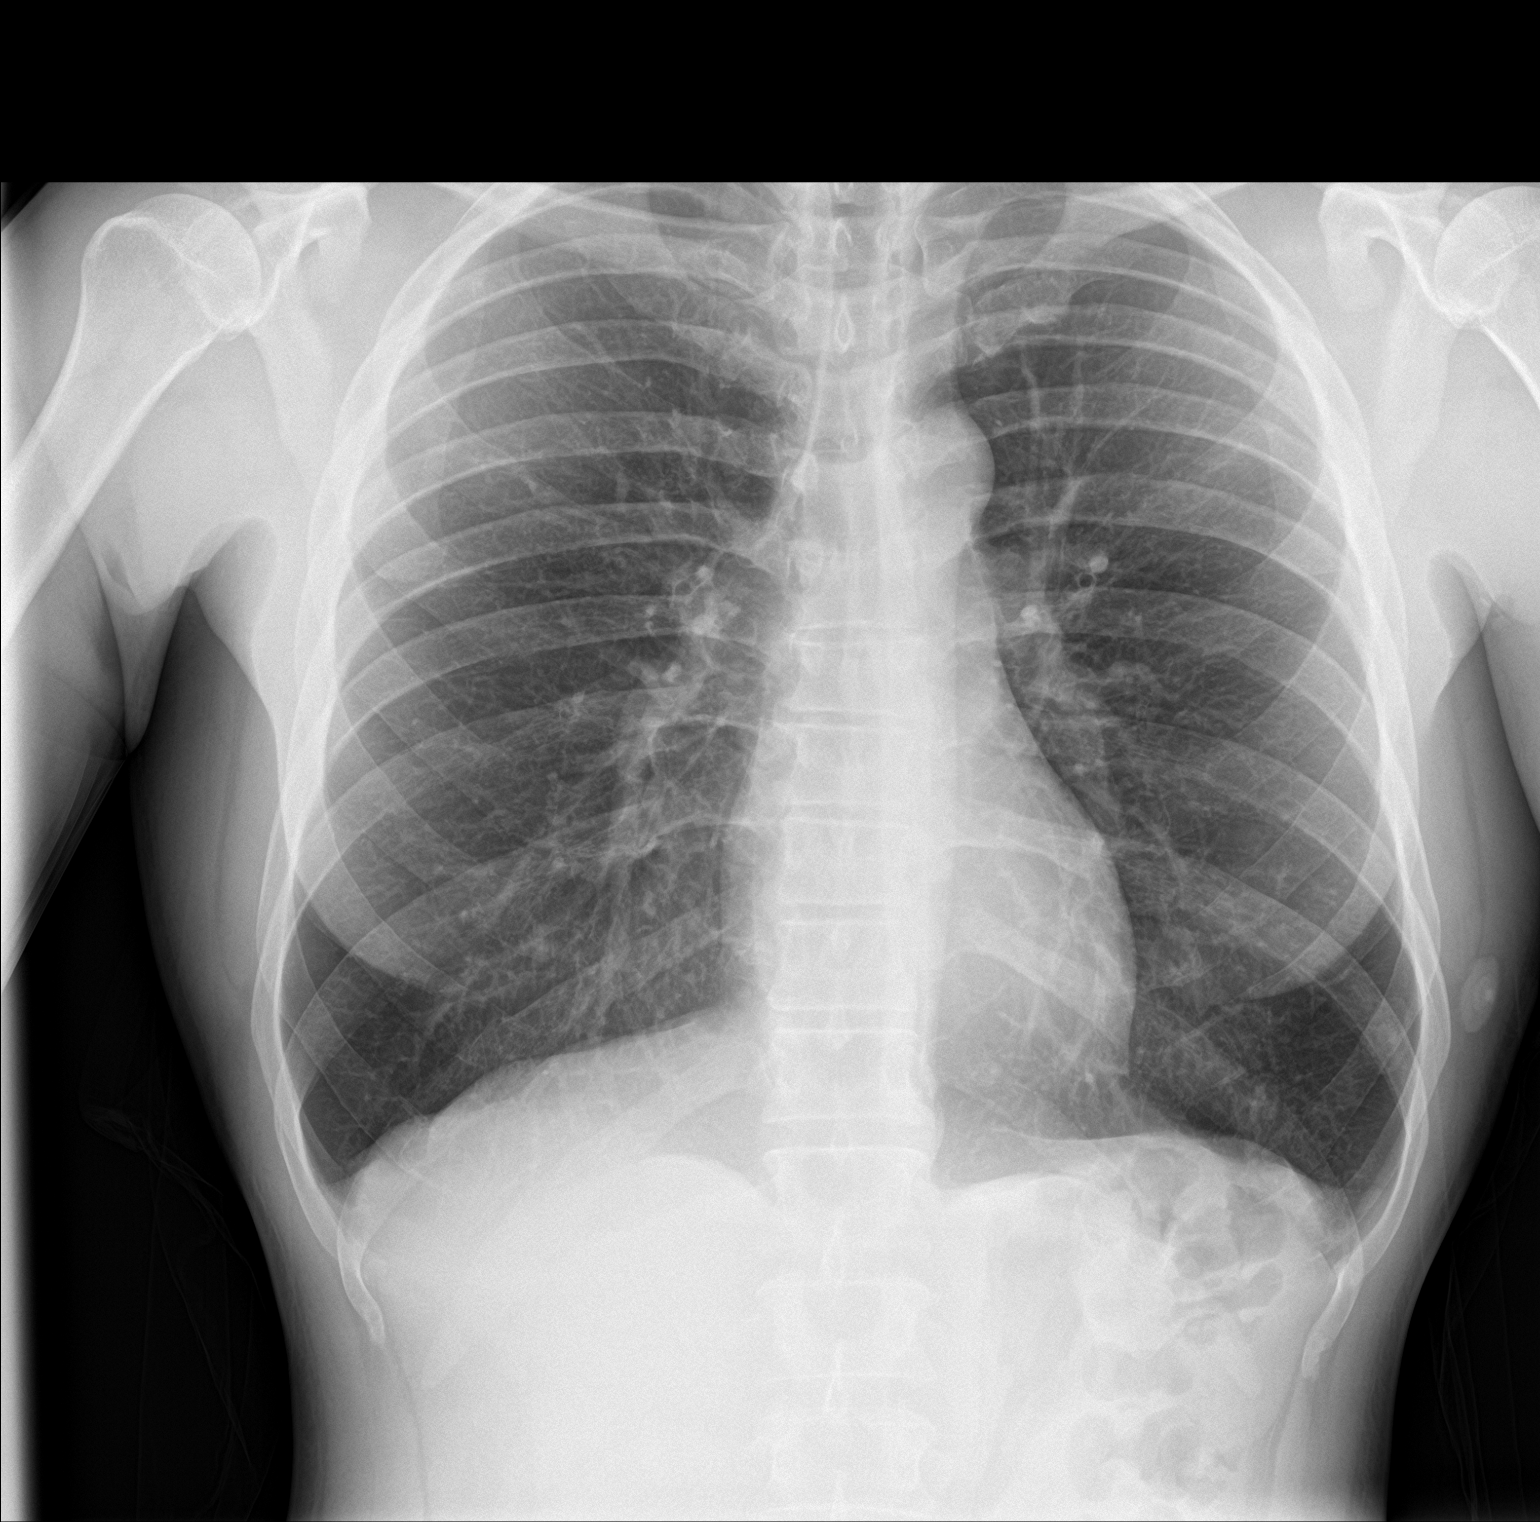

[chest lat]
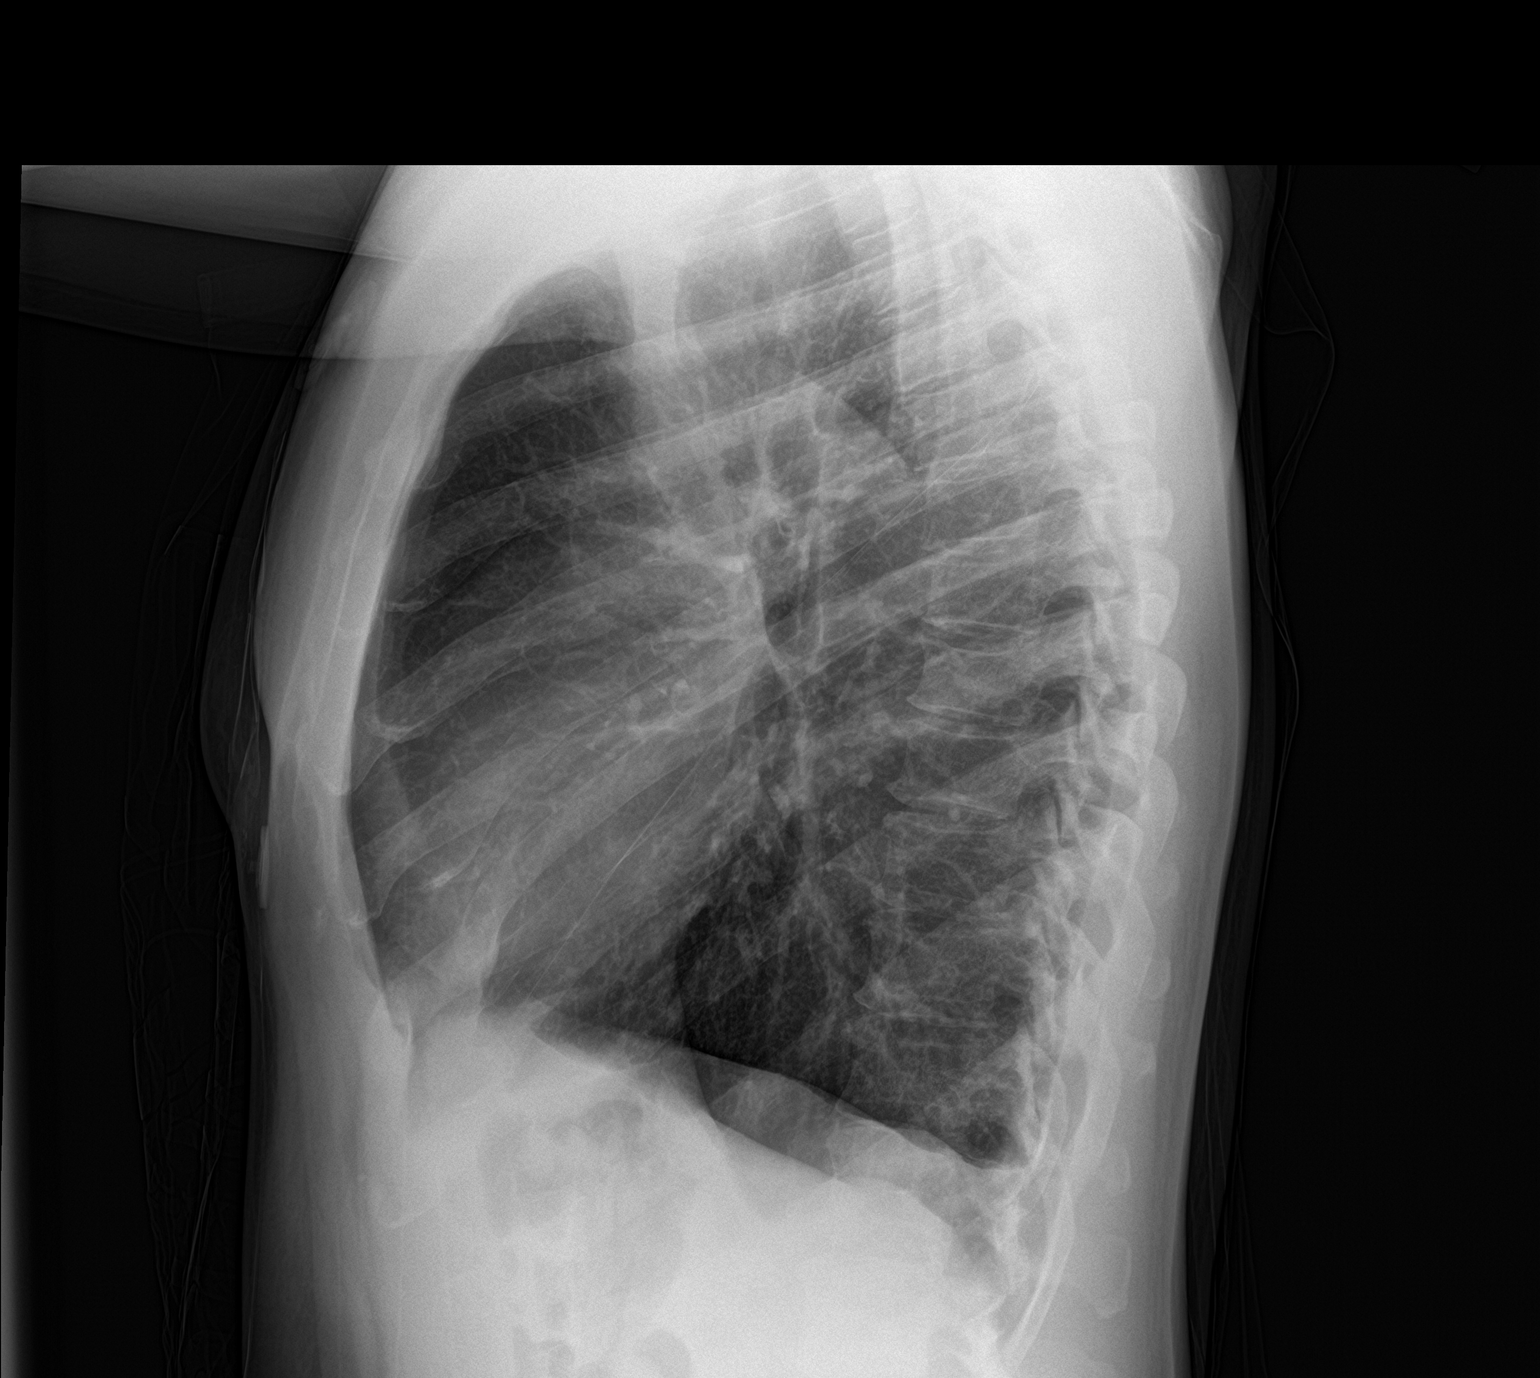

[2 of 2 positions shown; findings below may reference images not displayed]

FINDINGS: The lungs are hyperinflated. The cardiomediastinal contours are
normal. Pulmonary vasculature is normal. No consolidation, pleural
effusion, or pneumothorax. No acute osseous abnormalities are seen.
IMPRESSION: Hyperinflation consistent with asthma or bronchitis. No localizing
abnormality.
